# Patient Record
Sex: Female | Born: 2000 | Race: White | Hispanic: No | Marital: Single | Smoking: Never smoker
Health system: Southern US, Community
[De-identification: ages and names within clinical notes are randomized; demographics above are authoritative.]

---
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
## 2023-01-13 ENCOUNTER — Encounter: Payer: Self-pay | Admitting: Nurse Practitioner

## 2023-01-13 ENCOUNTER — Ambulatory Visit: Payer: Managed Care, Other (non HMO) | Admitting: Nurse Practitioner

## 2023-01-13 VITALS — BP 112/76 | Ht 66.0 in | Wt 186.0 lb

## 2023-01-13 DIAGNOSIS — Z01419 Encounter for gynecological examination (general) (routine) without abnormal findings: Secondary | ICD-10-CM | POA: Diagnosis not present

## 2023-01-13 DIAGNOSIS — Z0000 Encounter for general adult medical examination without abnormal findings: Secondary | ICD-10-CM

## 2023-01-13 NOTE — Progress Notes (Signed)
   Sarah Farley 2000-11-17 [id number]   History:  22 y.o. G0 presents as new patient to establish care. No GYN concerns. Monthly cycles. Has completed Gardasil series.   Gynecologic History Patient's last menstrual period was 01/07/2023 (exact date). Period Cycle (Days): 28 Period Duration (Days): 4 Period Pattern: Regular Menstrual Flow: Moderate Menstrual Control: Tampon Dysmenorrhea: (!) Moderate Dysmenorrhea Symptoms: Cramping Contraception/Family planning: abstinence Sexually active: Never  Health Maintenance Last Pap: Not indicated Last mammogram: Not indicated Last colonoscopy: Not indicated Last Dexa: Not indicated  Past medical history, past surgical history, family history and social history were all reviewed and documented in the EPIC chart. 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   ROS:  A ROS was performed and pertinent positives and negatives are included.  Exam:  Vitals:   01/13/23 1040  BP: 112/76  Weight: 186 lb (84.4 kg)  Height: 5\' 6"  (1.676 m)   Body mass index is 30.02 kg/m.  General appearance:  Normal Thyroid:  Symmetrical, normal in size, without palpable masses or nodularity. Respiratory  Auscultation:  Clear without wheezing or rhonchi Cardiovascular  Auscultation:  Regular rate, without rubs, murmurs or gallops  Edema/varicosities:  Not grossly evident Abdominal  Soft,nontender, without masses, guarding or rebound.  Liver/spleen:  No organomegaly noted  Hernia:  None appreciated  Skin  Inspection:  Grossly normal Breasts: Not indicated Genitourinary Not indicated  Assessment/Plan:  22 y.o. G0 to establish care.   Well female exam without gynecological exam - Education provided on SBEs, importance of preventative screenings, current guidelines, high calcium diet, regular exercise, safe sex, and multivitamin daily.   Return in 1 year for annual.     Olivia Mackie DNP, 10:58 AM 01/13/2023

## 2024-01-12 ENCOUNTER — Ambulatory Visit (INDEPENDENT_AMBULATORY_CARE_PROVIDER_SITE_OTHER): Admitting: Obstetrics and Gynecology

## 2024-01-12 ENCOUNTER — Other Ambulatory Visit (HOSPITAL_COMMUNITY)
Admission: RE | Admit: 2024-01-12 | Discharge: 2024-01-12 | Disposition: A | Discharge status: Home / Self Care | Source: Ambulatory Visit | Attending: Obstetrics and Gynecology | Admitting: Obstetrics and Gynecology

## 2024-01-12 ENCOUNTER — Encounter: Payer: Self-pay | Admitting: Obstetrics and Gynecology

## 2024-01-12 VITALS — BP 102/64 | HR 64 | Temp 98.8°F | Ht 67.0 in | Wt 169.8 lb

## 2024-01-12 DIAGNOSIS — Z124 Encounter for screening for malignant neoplasm of cervix: Secondary | ICD-10-CM | POA: Insufficient documentation

## 2024-01-12 DIAGNOSIS — Z01419 Encounter for gynecological examination (general) (routine) without abnormal findings: Secondary | ICD-10-CM | POA: Diagnosis not present

## 2024-01-12 DIAGNOSIS — Z1331 Encounter for screening for depression: Secondary | ICD-10-CM

## 2024-01-12 NOTE — Patient Instructions (Signed)

## 2024-01-12 NOTE — Addendum Note (Signed)
 Addended by: Evanee Lubrano on: 01/12/2024 02:41 PM   Modules accepted: Orders

## 2024-01-12 NOTE — Addendum Note (Signed)
 Addended by: Cleone Dad V on: 01/12/2024 05:11 PM   Modules accepted: Orders

## 2024-01-12 NOTE — Progress Notes (Signed)
 23 y.o. G0P0000 female here for annual exam. Single.  Rising college senior, Careers adviser.  Patient's last menstrual period was 01/08/2024 (approximate). Period Duration (Days): 4-5 Period Pattern: Regular Menstrual Flow: Moderate Menstrual Control: Tampon Dysmenorrhea: None (cramping the week before)  Started working out regularly and cooking more at home over the past year.  Noticed cycles have lengthened from 2 days to 4 days. 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  Abnormal bleeding: None Pelvic discharge or pain: None Breast mass, nipple discharge or skin changes : None Birth control: abstinent Last PAP: No results found for: "DIAGPAP", "HPVHIGH", "ADEQPAP" Gardasil: completed Sexually active: no  Exercising: Home exercise routine includes calisthenics and walking.  Smoker: no  Garment/textile technologist Visit from 01/12/2024 in [facility]  PHQ-2 Total Score 0         GYN HISTORY: No sig hx  OB History  Gravida Para Term Preterm AB Living  0 0 0 0 0 0  SAB IAB Ectopic Multiple Live Births  0 0 0 0 0    History reviewed. No pertinent past medical history.  History reviewed. No pertinent surgical history.  Current Outpatient Medications on File Prior to Visit  Medication Sig Dispense Refill   APPLE CIDER VINEGAR PO Take by mouth.     BIOTIN PO Take by mouth.     Probiotic Product (PROBIOTIC PO) Take by mouth.     No current facility-administered medications on file prior to visit.    Social History   Socioeconomic History   Marital status: Single    Spouse name: Not on file   Number of children: Not on file   Years of education: Not on file   Highest education level: Not on file  Occupational History   Not on file  Tobacco Use   Smoking status: Never    Passive exposure: Never   Smokeless tobacco: Never  Substance and Sexual Activity   Alcohol use: Yes    Comment: socially   Drug use: Never   Sexual  activity: Never    Partners: Male    Birth control/protection: Abstinence    Comment: menarche 23yo  Other Topics Concern   Not on file  Social History Narrative   Not on file   Social Drivers of Health   Financial Resource Strain: Not on file  Food Insecurity: No Food Insecurity (02/16/2021)   Received [facility]   Hunger Vital Sign    Worried About Running Out of Food in the Last Year: Never true    Ran Out of Food in the Last Year: Never true  Transportation Needs: Not on file  Physical Activity: Not on file  Stress: Not on file  Social Connections: Unknown (01/09/2022)   [facility] from [facility]   Social Network    Social Network: Not on file  Intimate Partner Violence: Unknown (12/02/2021)   Received from [facility]   HITS    Physically Hurt: Not on file    Insult or Talk Down To: Not on file    Threaten Physical Harm: Not on file    Scream or Curse: Not on file    Family History  Problem Relation Age of Onset   Other Mother        vein blockage in eye   Osteoarthritis Father    Cancer Maternal Grandmother        other    Allergies  Allergen Reactions   Sulfa Antibiotics Rash  From head and toe.   Amoxicillin Rash      PE Today's Vitals   01/12/24 1346  BP: 102/64  Pulse: 64  Temp: 98.8 F (37.1 C)  TempSrc: Oral  SpO2: 99%  Weight: 169 lb 12.8 oz (77 kg)  Height: 5\' 7"  (1.702 m)   Body mass index is 26.59 kg/m.  Physical Exam Vitals reviewed. Exam conducted with a chaperone present.  Constitutional:      General: She is not in acute distress.    Appearance: Normal appearance.  HENT:     Head: Normocephalic and atraumatic.     Nose: Nose normal.  Eyes:     Extraocular Movements: Extraocular movements intact.     Conjunctiva/sclera: Conjunctivae normal.  Neck:     Thyroid: No thyroid mass, thyromegaly or thyroid tenderness.  Pulmonary:     Effort: Pulmonary effort is normal.  Abdominal:     General: There  is no distension.     Palpations: Abdomen is soft.     Tenderness: There is no abdominal tenderness.  Genitourinary:    General: Normal vulva.     Exam position: Lithotomy position.     Urethra: No prolapse.     Vagina: Normal. No vaginal discharge or bleeding.     Cervix: Normal. No lesion.     Uterus: Normal. Not enlarged and not tender.      Adnexa: Right adnexa normal and left adnexa normal.  Musculoskeletal:        General: Normal range of motion.     Cervical back: Normal range of motion.  Lymphadenopathy:     Lower Body: No right inguinal adenopathy. No left inguinal adenopathy.  Skin:    General: Skin is warm and dry.  Neurological:     General: No focal deficit present.     Mental Status: She is alert.  Psychiatric:        Mood and Affect: Mood normal.        Behavior: Behavior normal.       Assessment and Plan:        Well woman exam with routine gynecological exam Assessment & Plan: Cervical cancer screening performed according to ASCCP guidelines. Encouraged annual mammogram screening- N/A Colonoscopy N/A DXA N/A Labs and immunizations with her primary Encouraged safe sexual practices as indicated Encouraged healthy lifestyle practices with diet and exercise For patients under 50yo, I recommend 1000mg  calcium daily and 600IU of vitamin D daily.   Orders: -     VITAMIN D 25 Hydroxy (Vit-D Deficiency, Fractures) -     Thyroid Panel With TSH -     CBC -     COMPLETE METABOLIC PANEL WITHOUT GFR  Cervical cancer screening -     Cytology - PAP  Negative depression screening    Brett Soza Hadassah Letters, MD

## 2024-01-12 NOTE — Assessment & Plan Note (Signed)
 Cervical cancer screening performed according to ASCCP guidelines. Encouraged annual mammogram screening- N/A Colonoscopy N/A DXA N/A Labs and immunizations with her primary Encouraged safe sexual practices as indicated Encouraged healthy lifestyle practices with diet and exercise For patients under 22yo, I recommend 1000mg  calcium daily and 600IU of vitamin D daily.

## 2024-01-17 ENCOUNTER — Ambulatory Visit: Payer: Self-pay | Admitting: Obstetrics and Gynecology

## 2024-01-17 LAB — CYTOLOGY - PAP: Diagnosis: NEGATIVE
